# Patient Record
Sex: Female | Born: 1995 | Hispanic: No | Marital: Single | Smoking: Current every day smoker
Health system: Southern US, Community
[De-identification: ages and names within clinical notes are randomized; demographics above are authoritative.]

---

## 2017-08-17 ENCOUNTER — Other Ambulatory Visit: Payer: Self-pay

## 2017-08-17 ENCOUNTER — Emergency Department
Admission: EM | Admit: 2017-08-17 | Discharge: 2017-08-17 | Disposition: A | Discharge status: Home / Self Care | Payer: 59 | Attending: Emergency Medicine | Admitting: Emergency Medicine

## 2017-08-17 ENCOUNTER — Emergency Department: Payer: 59

## 2017-08-17 ENCOUNTER — Encounter: Payer: Self-pay | Admitting: Emergency Medicine

## 2017-08-17 DIAGNOSIS — S62102A Fracture of unspecified carpal bone, left wrist, initial encounter for closed fracture: Secondary | ICD-10-CM | POA: Insufficient documentation

## 2017-08-17 DIAGNOSIS — Y9351 Activity, roller skating (inline) and skateboarding: Secondary | ICD-10-CM | POA: Diagnosis not present

## 2017-08-17 DIAGNOSIS — S6992XA Unspecified injury of left wrist, hand and finger(s), initial encounter: Secondary | ICD-10-CM | POA: Diagnosis present

## 2017-08-17 DIAGNOSIS — F17200 Nicotine dependence, unspecified, uncomplicated: Secondary | ICD-10-CM | POA: Insufficient documentation

## 2017-08-17 DIAGNOSIS — Y998 Other external cause status: Secondary | ICD-10-CM | POA: Insufficient documentation

## 2017-08-17 DIAGNOSIS — Y92838 Other recreation area as the place of occurrence of the external cause: Secondary | ICD-10-CM | POA: Diagnosis not present

## 2017-08-17 MED ORDER — IBUPROFEN 600 MG PO TABS
600.0000 mg | ORAL_TABLET | Freq: Once | ORAL | Status: AC
  Administered 2017-08-17: 600 mg via ORAL
  Filled 2017-08-17: qty 1

## 2017-08-17 MED ORDER — OXYCODONE-ACETAMINOPHEN 5-325 MG PO TABS
1.0000 | ORAL_TABLET | Freq: Once | ORAL | Status: AC
  Administered 2017-08-17: 1 via ORAL
  Filled 2017-08-17: qty 1

## 2017-08-17 MED ORDER — TRAMADOL HCL 50 MG PO TABS: 50.0000 mg | ORAL_TABLET | Freq: Four times a day (QID) | ORAL | 0 refills | Status: AC | PRN

## 2017-08-17 MED ORDER — IBUPROFEN 600 MG PO TABS: 600.0000 mg | ORAL_TABLET | Freq: Three times a day (TID) | ORAL | 0 refills | Status: AC | PRN

## 2017-08-17 NOTE — Discharge Instructions (Addendum)
Wear splint and follow-up with [facility] doctor.

## 2017-08-17 NOTE — ED Notes (Signed)

## 2017-08-17 NOTE — ED Triage Notes (Signed)
Pt fell off skateboard last night onto left wrist. Pain to this area, worse with movement.  No obvious deformity.

## 2017-08-17 NOTE — ED Provider Notes (Addendum)
[facility] Emergency Department Provider Note   ____________________________________________   First MD Initiated Contact with Patient 08/17/17 1308     (approximate)  I have reviewed the triage vital signs and the nursing notes.   HISTORY  Chief Complaint Wrist Pain    HPI Anne Tran is a 22 y.o. female patient complain of left wrist pain secondary to a fall from a skateboard last night.  Patient the pain increased with movement.  Patient denies loss of sensation.  Patient is right-hand dominant.  Patient rates the pain as 7/10.  Patient described the pain is "throbbing".  No palliative measures for complaint.  History reviewed. No pertinent past medical history.  There are no active problems to display for this patient.   History reviewed. No pertinent surgical history.  Prior to Admission medications   Medication Sig Start Date End Date Taking? Authorizing Provider  ibuprofen (ADVIL,MOTRIN) 600 MG tablet Take 1 tablet (600 mg total) by mouth every 8 (eight) hours as needed. 08/17/17   Joni Reining, PA-C  traMADol (ULTRAM) 50 MG tablet Take 1 tablet (50 mg total) by mouth every 6 (six) hours as needed. 08/17/17 08/17/18  Joni Reining, PA-C    Allergies Patient has no known allergies.  History reviewed. No pertinent family history.  Social History Social History   Tobacco Use  . Smoking status: Current Every Day Smoker  . Smokeless tobacco: Never Used  Substance Use Topics  . Alcohol use: Yes  . Drug use: Not on file    Review of Systems Constitutional: No fever/chills Eyes: No visual changes. ENT: No sore throat. Cardiovascular: Denies chest pain. Respiratory: Denies shortness of breath. Gastrointestinal: No abdominal pain.  No nausea, no vomiting.  No diarrhea.  No constipation. Genitourinary: Negative for dysuria. Musculoskeletal: Left wrist pain. Skin: Negative for rash. Neurological: Negative for headaches, focal  weakness or numbness.   ____________________________________________   PHYSICAL EXAM:  VITAL SIGNS: ED Triage Vitals  Enc Vitals Group     BP 08/17/17 1258 124/76     Pulse Rate 08/17/17 1258 88     Resp 08/17/17 1258 14     Temp 08/17/17 1258 97.6 F (36.4 C)     Temp Source 08/17/17 1258 Oral     SpO2 08/17/17 1258 100 %     Weight 08/17/17 1256 130 lb (59 kg)     Height 08/17/17 1256  (1.702 m)     Head Circumference --      Peak Flow --      Pain Score 08/17/17 1256 7     Pain Loc --      Pain Edu? --      Excl. in GC? --    Constitutional: Alert and oriented. Well appearing and in no acute distress. Cardiovascular: Normal rate, regular rhythm. Grossly normal heart sounds.  Good peripheral circulation. Respiratory: Normal respiratory effort.  No retractions. Lungs CTAB. Musculoskeletal: No obvious deformity to the left wrist.  Patient has moderate guarding palpation of the distal radius and ulna.  Patient decreased range of motion flexion extension limited by complaint of pain. Neurologic:  Normal speech and language. No gross focal neurologic deficits are appreciated. No gait instability. Skin:  Skin is warm, dry and intact. No rash noted. Psychiatric: Mood and affect are normal. Speech and behavior are normal.  ____________________________________________   LABS (all labs ordered are listed, but only abnormal results are displayed)  Labs Reviewed - No data to display ____________________________________________  EKG   ____________________________________________  RADIOLOGY    Official radiology report(s): Dg Wrist Complete Left  Result Date: 08/17/2017 CLINICAL DATA:  22 year old female with a history of fall skateboarding EXAM: LEFT WRIST - COMPLETE 3+ VIEW COMPARISON:  None. FINDINGS: Acute fracture of the distal radius metadiaphysis with associated soft tissue swelling. Lateral view demonstrates mild dorsal angulation Acute fracture of the distal  ulnar styloid, with a chip fracture/avulsion fracture at the distal ulnar cortex. IMPRESSION: Acute fractures of the distal radius and ulna, as above. Associated soft tissue swelling. Electronically Signed   By: Gilmer Mor D.O.   On: 08/17/2017 13:53    ____________________________________________   PROCEDURES  Procedure(s) performed: None  .Splint Application Date/Time: 08/17/2017 2:13 PM Performed by: Joni Reining, PA-C Authorized by: Joni Reining, PA-C   Consent:    Consent obtained:  Verbal   Consent given by:  Patient   Risks discussed:  Numbness, pain and swelling Pre-procedure details:    Sensation:  Normal Procedure details:    Laterality:  Left   Location:  Wrist   Wrist:  L wrist   Strapping: no     Splint type:  Wrist   Supplies:  Ortho-Glass Post-procedure details:    Pain:  Unchanged   Sensation:  Normal   Patient tolerance of procedure:  Tolerated well, no immediate complications    Critical Care performed: No  ____________________________________________   INITIAL IMPRESSION / ASSESSMENT AND PLAN / ED COURSE  As part of my medical decision making, I reviewed the following data within the electronic MEDICAL RECORD NUMBER    Left wrist fracture involving the distal radius and ulna.  Discussed x-ray findings with patient.  Patient placed in a splint.  Patient given discharge care instruction.  Patient will follow-up at [facility] since she is leaving this area due to school closure.     ____________________________________________   FINAL CLINICAL IMPRESSION(S) / ED DIAGNOSES  Final diagnoses:  Closed fracture of left wrist, initial encounter     ED Discharge Orders        Ordered    traMADol (ULTRAM) 50 MG tablet  Every 6 hours PRN     08/17/17 1407    ibuprofen (ADVIL,MOTRIN) 600 MG tablet  Every 8 hours PRN     08/17/17 1407       Note:  This document was prepared using Dragon voice recognition software and may  include unintentional dictation errors.    Joni Reining, PA-C 08/17/17 1412    Joni Reining, PA-C 08/17/17 1414    Jene Every, MD 08/19/17 [phone]

## 2019-07-03 IMAGING — DX DG WRIST COMPLETE 3+V*L*
4 series · 4 of 4 positions shown · non-contrast
Comparison: None.

CLINICAL DATA: 22-year-old female with a history of fall
skateboarding

EXAM:
LEFT WRIST - COMPLETE 3+ VIEW

[wrist ap (1 of 2)]
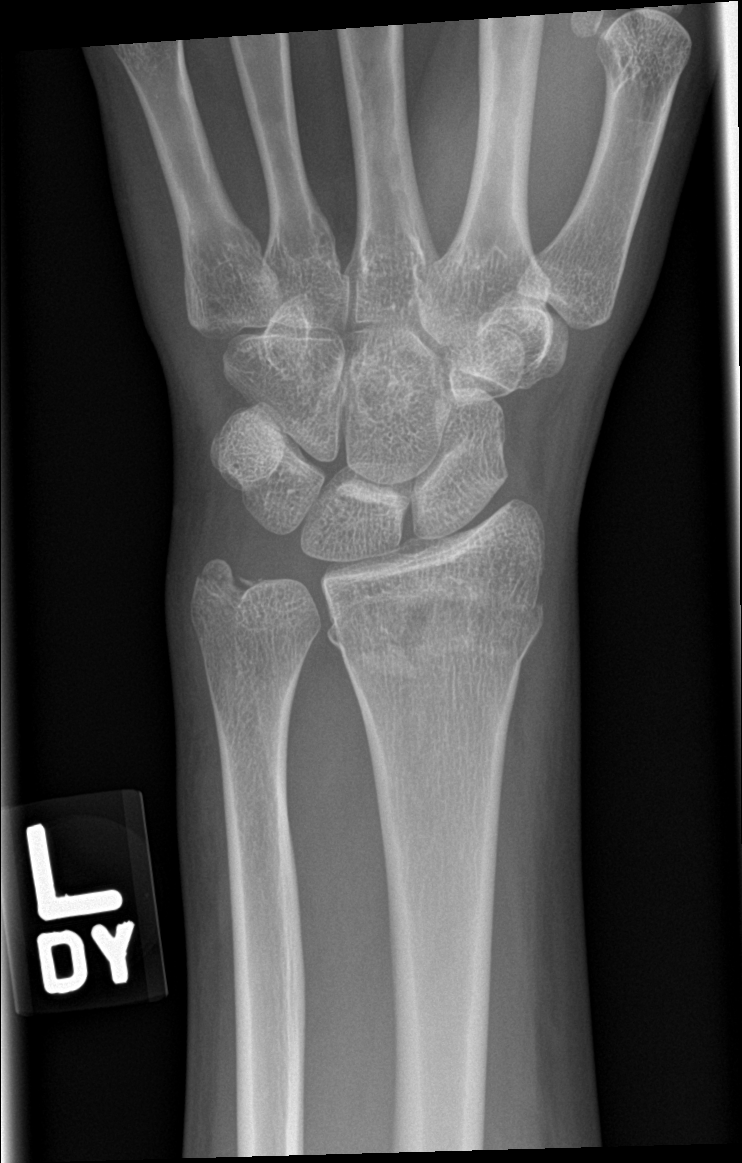

[wrist obl]
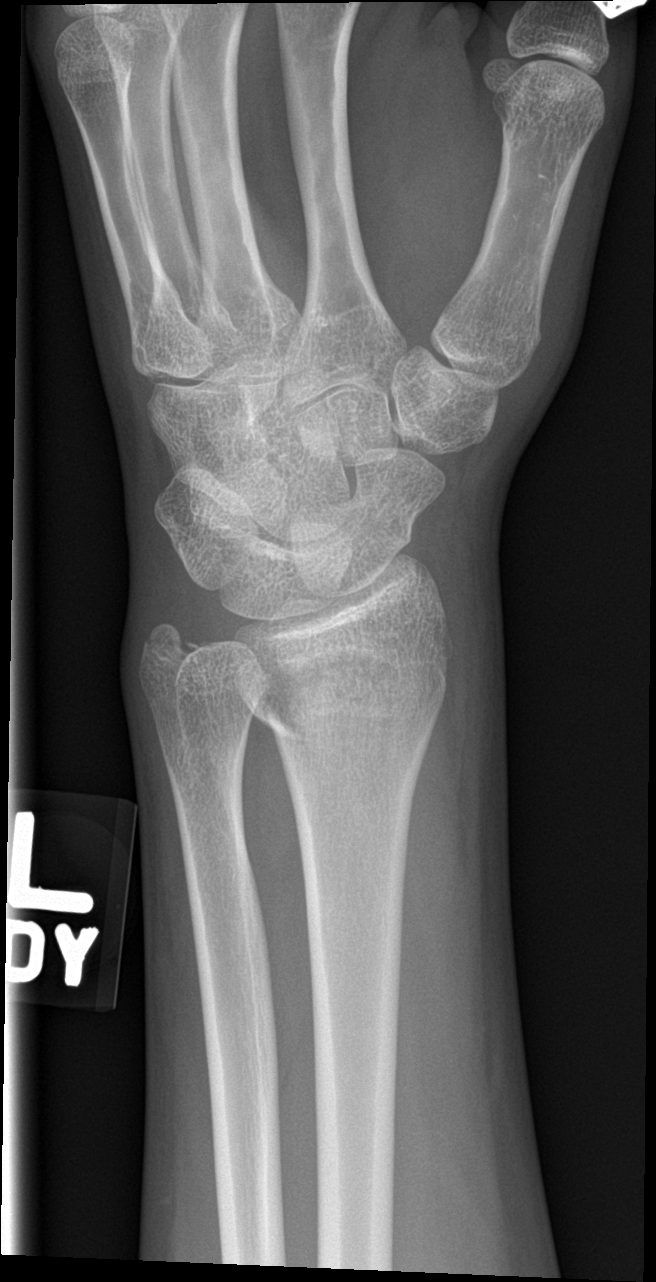

[wrist lat]
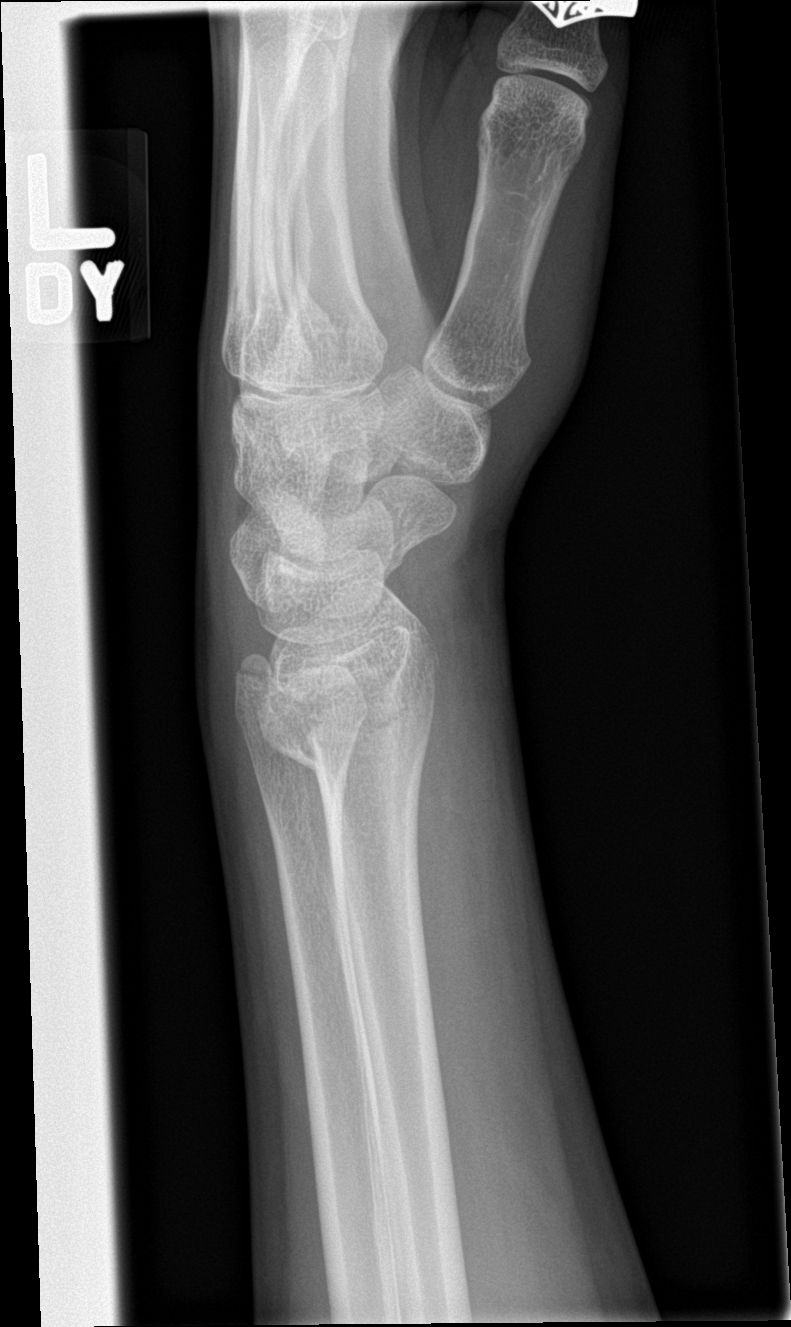

[wrist ap (2 of 2)]
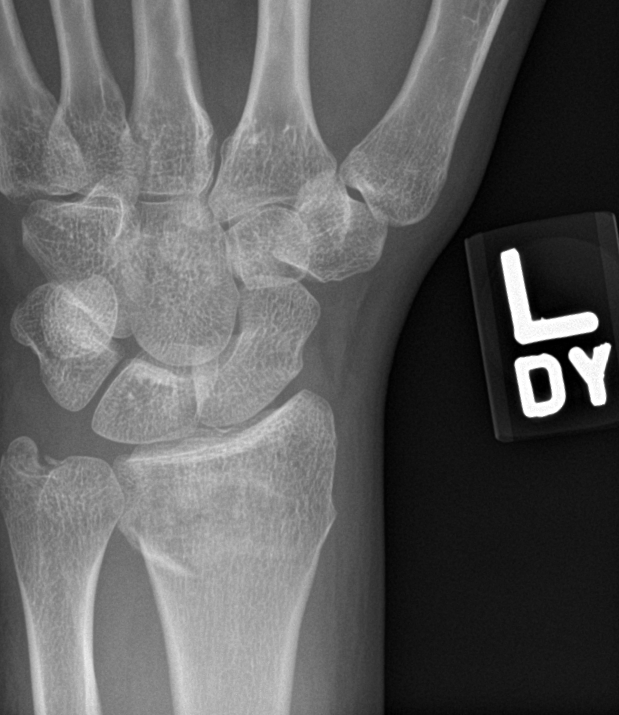

[4 of 4 positions shown; findings below may reference images not displayed]

FINDINGS: Acute fracture of the distal radius metadiaphysis with associated
soft tissue swelling. Lateral view demonstrates mild dorsal
angulation

Acute fracture of the distal ulnar styloid, with a chip
fracture/avulsion fracture at the distal ulnar cortex.
IMPRESSION: Acute fractures of the distal radius and ulna, as above. Associated
soft tissue swelling.
